# Patient Record
Sex: Male | Born: 1950 | Hispanic: No | Marital: Single | Smoking: Never smoker
Health system: Southern US, Community
[De-identification: ages and names within clinical notes are randomized; demographics above are authoritative.]

## PROBLEM LIST (undated history)

## (undated) DIAGNOSIS — E7800 Pure hypercholesterolemia, unspecified: Secondary | ICD-10-CM

## (undated) DIAGNOSIS — I10 Essential (primary) hypertension: Secondary | ICD-10-CM

## (undated) DIAGNOSIS — D696 Thrombocytopenia, unspecified: Secondary | ICD-10-CM

## (undated) DIAGNOSIS — N400 Enlarged prostate without lower urinary tract symptoms: Secondary | ICD-10-CM

## (undated) HISTORY — DX: Essential (primary) hypertension: I10

## (undated) HISTORY — PX: CATARACT EXTRACTION, BILATERAL: SHX1313

## (undated) HISTORY — PX: TONSILECTOMY/ADENOIDECTOMY WITH MYRINGOTOMY: SHX6125

## (undated) HISTORY — DX: Pure hypercholesterolemia, unspecified: E78.00

## (undated) HISTORY — DX: Benign prostatic hyperplasia without lower urinary tract symptoms: N40.0

## (undated) HISTORY — DX: Thrombocytopenia, unspecified: D69.6

---

## 2015-07-27 ENCOUNTER — Other Ambulatory Visit: Payer: Self-pay | Admitting: Family Medicine

## 2015-07-27 DIAGNOSIS — E010 Iodine-deficiency related diffuse (endemic) goiter: Secondary | ICD-10-CM

## 2015-08-01 ENCOUNTER — Ambulatory Visit
Admission: RE | Admit: 2015-08-01 | Discharge: 2015-08-01 | Disposition: A | Discharge status: Home / Self Care | Payer: 59 | Source: Ambulatory Visit | Attending: Family Medicine | Admitting: Family Medicine

## 2015-08-01 DIAGNOSIS — E010 Iodine-deficiency related diffuse (endemic) goiter: Secondary | ICD-10-CM

## 2016-08-11 ENCOUNTER — Other Ambulatory Visit: Payer: Self-pay | Admitting: Student

## 2016-08-11 DIAGNOSIS — S43431S Superior glenoid labrum lesion of right shoulder, sequela: Secondary | ICD-10-CM

## 2016-08-11 DIAGNOSIS — M7521 Bicipital tendinitis, right shoulder: Secondary | ICD-10-CM

## 2016-08-21 ENCOUNTER — Ambulatory Visit
Admission: RE | Admit: 2016-08-21 | Discharge: 2016-08-21 | Disposition: A | Discharge status: Home / Self Care | Payer: 59 | Source: Ambulatory Visit | Attending: Student | Admitting: Student

## 2016-08-21 DIAGNOSIS — S43431S Superior glenoid labrum lesion of right shoulder, sequela: Secondary | ICD-10-CM

## 2016-08-21 DIAGNOSIS — M7521 Bicipital tendinitis, right shoulder: Secondary | ICD-10-CM

## 2017-01-10 ENCOUNTER — Emergency Department
Admission: EM | Admit: 2017-01-10 | Discharge: 2017-01-10 | Disposition: A | Discharge status: Home / Self Care | Payer: Medicare Other | Attending: Emergency Medicine | Admitting: Emergency Medicine

## 2017-01-10 ENCOUNTER — Encounter: Payer: Self-pay | Admitting: Emergency Medicine

## 2017-01-10 DIAGNOSIS — W57XXXA Bitten or stung by nonvenomous insect and other nonvenomous arthropods, initial encounter: Secondary | ICD-10-CM | POA: Diagnosis not present

## 2017-01-10 DIAGNOSIS — Y999 Unspecified external cause status: Secondary | ICD-10-CM | POA: Insufficient documentation

## 2017-01-10 DIAGNOSIS — S60561A Insect bite (nonvenomous) of right hand, initial encounter: Secondary | ICD-10-CM | POA: Insufficient documentation

## 2017-01-10 DIAGNOSIS — R2231 Localized swelling, mass and lump, right upper limb: Secondary | ICD-10-CM | POA: Diagnosis present

## 2017-01-10 DIAGNOSIS — Y929 Unspecified place or not applicable: Secondary | ICD-10-CM | POA: Insufficient documentation

## 2017-01-10 DIAGNOSIS — Y939 Activity, unspecified: Secondary | ICD-10-CM | POA: Insufficient documentation

## 2017-01-10 MED ORDER — DOXYCYCLINE HYCLATE 100 MG PO CAPS: 100.0000 mg | ORAL_CAPSULE | Freq: Two times a day (BID) | ORAL | 0 refills | Status: DC

## 2017-01-10 MED ORDER — DOXYCYCLINE HYCLATE 100 MG PO TABS
100.0000 mg | ORAL_TABLET | Freq: Once | ORAL | Status: AC
Start: 2017-01-10 — End: 2017-01-10
  Administered 2017-01-10: 100 mg via ORAL
  Filled 2017-01-10: qty 1

## 2017-01-10 NOTE — ED Provider Notes (Signed)
[facility] Emergency Department Provider Note  ____________________________________________  Time seen: Approximately 10:11 PM  I have reviewed the triage vital signs and the nursing notes.   HISTORY  Chief Complaint Wound Infection   HPI Dustin Daniels is a 66 y.o. male who presents to the emergency department for evaluation and treatment of erythema and a blister that is between his fourth and fifth fingers that started after he pulled a tick off of his hand yesterday. He states that today the area of erythema has spread to the back of his hand and is now going around the finger into the palm of his hand. He reports having previous reactions to tick bites that are similar to this, but that the redness typically goes away by the next day. He states that he has had episodes where he has been stung and large blisters have risen. He feels that these blisters "trapped in the venom."  History reviewed. No pertinent past medical history.  There are no active problems to display for this patient.   History reviewed. No pertinent surgical history.  Prior to Admission medications   Medication Sig Start Date End Date Taking? Authorizing Provider  doxycycline (VIBRAMYCIN) 100 MG capsule Take 1 capsule (100 mg total) by mouth 2 (two) times daily. 01/10/17   Chinita Pester, FNP    Allergies Penicillins  History reviewed. No pertinent family history.  Social History Social History  Substance Use Topics  . Smoking status: Never Smoker  . Smokeless tobacco: Never Used  . Alcohol use No    Review of Systems  Constitutional: Negative for fever. Respiratory: Negative for cough or shortness of breath.  Musculoskeletal: Negative for myalgias Skin: Positive for tick bite and blister Neurological: Negative for numbness or paresthesias. ____________________________________________   PHYSICAL EXAM:  VITAL SIGNS: ED Triage Vitals [01/10/17 2005]  Enc Vitals  Group     BP (!) 150/90     Pulse Rate 80     Resp 18     Temp 98.6 F (37 C)     Temp Source Oral     SpO2 97 %     Weight 165 lb (74.8 kg)     Height 5\' 10"  (1.778 m)     Head Circumference      Peak Flow      Pain Score 0     Pain Loc      Pain Edu?      Excl. in GC?      Constitutional: Well appearing. Eyes: Conjunctivae are clear without discharge or drainage. Nose: No rhinorrhea noted. Mouth/Throat: Airway is patent.  Neck: No stridor. Unrestricted range of motion observed.  Cardiovascular: Capillary refill is <3 seconds.  Respiratory: Respirations are even and unlabored.. Musculoskeletal: Unrestricted range of motion observed. Neurologic: Awake, alert, and oriented x 4.  Skin:  Webspace of the right hand between the fourth and fifth fingers demonstrates 2 small vesicles with a surrounding area of erythema that extends towards the dorsal aspect of the hand as well as towards the palmar surface. There is no apparent lymphangitis.  ____________________________________________   LABS (all labs ordered are listed, but only abnormal results are displayed)  Labs Reviewed - No data to display ____________________________________________  EKG  Not indicated ____________________________________________  RADIOLOGY  Not indicated ____________________________________________   PROCEDURES  Procedure(s) performed: None ____________________________________________   INITIAL IMPRESSION / ASSESSMENT AND PLAN / ED COURSE  Dustin Daniels is a 66 y.o. male who presents to the emergency department for  evaluation and treatment of vesicles and an erythematous area on the right hand after removing a tick yesterday. He reports having similar reaction to insect bites, but states that the redness typically dissipates over 24 hours and this time it has only gotten larger. He was given a prescription for doxycycline and advised to follow up with his primary care provider for  symptoms that are not improving over the next 2-3 days. He was advised to return return to the ER for symptoms that change or worsen if unable to schedule an appointment.   Pertinent labs & imaging results that were available during my care of the patient were reviewed by me and considered in my medical decision making (see chart for details). ____________________________________________   FINAL CLINICAL IMPRESSION(S) / ED DIAGNOSES  Final diagnoses:  Tick bite, initial encounter    Discharge Medication List as of 01/10/2017  9:56 PM    START taking these medications   Details  doxycycline (VIBRAMYCIN) 100 MG capsule Take 1 capsule (100 mg total) by mouth 2 (two) times daily., Starting Sat 01/10/2017, Print        If controlled substance prescribed during this visit, 12 month history viewed on the [facility] prior to issuing an initial prescription for Schedule II or III opiod.   Note:  This document was prepared using Dragon voice recognition software and may include unintentional dictation errors.    Chinita Pester, FNP 01/10/17 2220    Jeanmarie Plant, MD 01/11/17 1102

## 2017-01-10 NOTE — ED Triage Notes (Signed)
Pt states he removed a tick from between his right fourth and fifth finger yesterday. Pt now has redness and small abscess noted to area. Pt denies finger.

## 2017-01-10 NOTE — Discharge Instructions (Signed)
Please take the antibiotic as prescribed until finished. If the area does not improve over the next 2-3 days, please follow up with her primary care provider. If you experience fevers or other symptoms of concern and you are unable to schedule an appointment, return to the emergency department.

## 2017-01-10 NOTE — ED Notes (Signed)

## 2017-01-10 NOTE — ED Notes (Signed)
Pt stating that he "pulled a tick" from in between his 4th and 5th fingers of his right hand. Pt stating that he is concerned for lyme's disease. Pt has some swelling to his 5th knuckle area. Pt denying any fevers.

## 2018-11-29 ENCOUNTER — Other Ambulatory Visit: Payer: Self-pay | Admitting: Sports Medicine

## 2018-11-29 DIAGNOSIS — M19011 Primary osteoarthritis, right shoulder: Secondary | ICD-10-CM

## 2018-11-29 DIAGNOSIS — G8929 Other chronic pain: Secondary | ICD-10-CM

## 2018-11-29 DIAGNOSIS — M7541 Impingement syndrome of right shoulder: Secondary | ICD-10-CM

## 2018-11-29 DIAGNOSIS — M778 Other enthesopathies, not elsewhere classified: Secondary | ICD-10-CM

## 2018-12-11 ENCOUNTER — Other Ambulatory Visit: Payer: Self-pay

## 2018-12-11 ENCOUNTER — Ambulatory Visit
Admission: RE | Admit: 2018-12-11 | Discharge: 2018-12-11 | Disposition: A | Discharge status: Home / Self Care | Payer: Medicare Other | Source: Ambulatory Visit | Attending: Sports Medicine | Admitting: Sports Medicine

## 2018-12-11 DIAGNOSIS — M778 Other enthesopathies, not elsewhere classified: Secondary | ICD-10-CM

## 2018-12-11 DIAGNOSIS — M25512 Pain in left shoulder: Secondary | ICD-10-CM

## 2018-12-11 DIAGNOSIS — G8929 Other chronic pain: Secondary | ICD-10-CM

## 2018-12-11 DIAGNOSIS — M7541 Impingement syndrome of right shoulder: Secondary | ICD-10-CM

## 2018-12-11 DIAGNOSIS — M19011 Primary osteoarthritis, right shoulder: Secondary | ICD-10-CM

## 2018-12-29 ENCOUNTER — Ambulatory Visit: Payer: Medicare Other

## 2019-01-27 ENCOUNTER — Other Ambulatory Visit: Payer: Self-pay

## 2019-01-27 ENCOUNTER — Ambulatory Visit
Admission: RE | Admit: 2019-01-27 | Discharge: 2019-01-27 | Disposition: A | Discharge status: Home / Self Care | Payer: Medicare Other | Source: Ambulatory Visit | Attending: Sports Medicine | Admitting: Sports Medicine

## 2019-01-27 DIAGNOSIS — M7581 Other shoulder lesions, right shoulder: Secondary | ICD-10-CM | POA: Diagnosis present

## 2019-01-27 DIAGNOSIS — M25512 Pain in left shoulder: Secondary | ICD-10-CM | POA: Insufficient documentation

## 2019-01-27 DIAGNOSIS — M19011 Primary osteoarthritis, right shoulder: Secondary | ICD-10-CM | POA: Diagnosis present

## 2019-01-27 DIAGNOSIS — G8929 Other chronic pain: Secondary | ICD-10-CM | POA: Diagnosis present

## 2019-01-27 DIAGNOSIS — M7541 Impingement syndrome of right shoulder: Secondary | ICD-10-CM | POA: Insufficient documentation

## 2020-03-29 ENCOUNTER — Inpatient Hospital Stay: Payer: Medicare Other

## 2020-03-29 ENCOUNTER — Encounter (INDEPENDENT_AMBULATORY_CARE_PROVIDER_SITE_OTHER): Payer: Self-pay

## 2020-03-29 ENCOUNTER — Inpatient Hospital Stay: Payer: Medicare Other | Attending: Oncology | Admitting: Oncology

## 2020-03-29 ENCOUNTER — Encounter: Payer: Self-pay | Admitting: Oncology

## 2020-03-29 VITALS — BP 146/103 | HR 62 | Temp 97.5°F | Resp 16 | Ht 70.0 in | Wt 151.0 lb

## 2020-03-29 DIAGNOSIS — D6959 Other secondary thrombocytopenia: Secondary | ICD-10-CM | POA: Diagnosis present

## 2020-03-29 DIAGNOSIS — D696 Thrombocytopenia, unspecified: Secondary | ICD-10-CM | POA: Diagnosis not present

## 2020-03-29 NOTE — Progress Notes (Signed)
Pt was concerned about platelet coung they have been in low normals for years but this time it was below the low normal range

## 2020-04-01 NOTE — Progress Notes (Signed)
Hematology/Oncology Consult note [facility] [phone] [phone]  Patient Care Team: Marisue Ivan, MD as PCP - General (Family Medicine)   Name of the patient: Dustin Daniels  [id number]  11/23/1950    Reason for referral- thrombocytopenia   Referring physician- DR. Linthavong   Date of visit: 04/01/20   History of presenting illness- patient is a 69 year old male with a past medical history significant for hypertension hypercholesterolemia and BPH among other medical problems.  He has been referred to [place] for thrombocytopenia.  Most recent CBC from 03/22/2020 showed white count of 4.9, H&H of 15.3/45.3 and platelet count of 113.  Looking back at his prior CBCs patient has had mild chronic thrombocytopenia with a platelet count fluctuates between 120s to 140s.  He has always had isolated thrombocytopenia in the absence of other cytopenias.  He was also tested positive for Covid in August 2021.  Denies any recent changes in his medications today.  Denies any over-the-counter herbal supplements.  Denies any bleeding or bruising.  ECOG PS- 1  Pain scale- 0   Review of systems- Review of Systems  Constitutional: Negative for chills, fever, malaise/fatigue and weight loss.  HENT: Negative for congestion, ear discharge and nosebleeds.   Eyes: Negative for blurred vision.  Respiratory: Negative for cough, hemoptysis, sputum production, shortness of breath and wheezing.   Cardiovascular: Negative for chest pain, palpitations, orthopnea and claudication.  Gastrointestinal: Negative for abdominal pain, blood in stool, constipation, diarrhea, heartburn, melena, nausea and vomiting.  Genitourinary: Negative for dysuria, flank pain, frequency, hematuria and urgency.  Musculoskeletal: Negative for back pain, joint pain and myalgias.  Skin: Negative for rash.  Neurological: Negative for dizziness, tingling, focal weakness, seizures, weakness and  headaches.  Endo/Heme/Allergies: Does not bruise/bleed easily.  Psychiatric/Behavioral: Negative for depression and suicidal ideas. The patient does not have insomnia.     Allergies  Allergen Reactions  . Penicillins     There are no problems to display for this patient.    Past Medical History:  Diagnosis Date  . BPH (benign prostatic hyperplasia)   . High cholesterol   . Hypertension   . Thrombocytopenia (HCC)      Past Surgical History:  Procedure Laterality Date  . CATARACT EXTRACTION, BILATERAL     pt said it was 15 years ago and does not remember how he got it  . TONSILECTOMY/ADENOIDECTOMY WITH MYRINGOTOMY      Social History   Socioeconomic History  . Marital status: Single    Spouse name: Not on file  . Number of children: Not on file  . Years of education: Not on file  . Highest education level: Not on file  Occupational History  . Not on file  Tobacco Use  . Smoking status: Never Smoker  . Smokeless tobacco: Never Used  Vaping Use  . Vaping Use: Never used  Substance and Sexual Activity  . Alcohol use: Yes    Comment: litlle amount .6 oz. 2-3 times a year  . Drug use: No  . Sexual activity: Yes  Other Topics Concern  . Not on file  Social History Narrative  . Not on file   Social Determinants of Health   Financial Resource Strain:   . Difficulty of Paying Living Expenses: Not on file  Food Insecurity:   . Worried About Programme researcher, broadcasting/film/video in the Last Year: Not on file  . Ran Out of Food in the Last Year: Not on file  Transportation Needs:   .  Lack of Transportation (Medical): Not on file  . Lack of Transportation (Non-Medical): Not on file  Physical Activity:   . Days of Exercise per Week: Not on file  . Minutes of Exercise per Session: Not on file  Stress:   . Feeling of Stress : Not on file  Social Connections:   . Frequency of Communication with Friends and Family: Not on file  . Frequency of Social Gatherings with Friends and  Family: Not on file  . Attends Religious Services: Not on file  . Active Member of Clubs or Organizations: Not on file  . Attends Banker Meetings: Not on file  . Marital Status: Not on file  Intimate Partner Violence:   . Fear of Current or Ex-Partner: Not on file  . Emotionally Abused: Not on file  . Physically Abused: Not on file  . Sexually Abused: Not on file     No family history on file.  No current outpatient medications on file.   Physical exam:  Vitals:   03/29/20 1019  BP: (!) 146/103  Pulse: 62  Resp: 16  Temp: (!) 97.5 F (36.4 C)  TempSrc: Tympanic  Weight: 151 lb (68.5 kg)  Height: 5\' 10"  (1.778 m)   Physical Exam HENT:     Head: Normocephalic and atraumatic.  Eyes:     Pupils: Pupils are equal, round, and reactive to light.  Cardiovascular:     Rate and Rhythm: Normal rate and regular rhythm.     Heart sounds: Normal heart sounds.  Pulmonary:     Effort: Pulmonary effort is normal.     Breath sounds: Normal breath sounds.  Abdominal:     General: Bowel sounds are normal.     Palpations: Abdomen is soft.     Comments: No palpable hepatosplenomegaly  Musculoskeletal:     Cervical back: Normal range of motion.  Lymphadenopathy:     Comments: No palpable cervical, supraclavicular, axillary or inguinal adenopathy   Skin:    General: Skin is warm and dry.  Neurological:     Mental Status: He is alert and oriented to person, place, and time.         Assessment and plan- Patient is a 69 y.o. male referred for mild isolated thrombocytopenia  Patient has chronic thrombocytopenia in the absence of other cytopenias.  CMP is normal not suggestive of any chronic liver disease.  Hepatitis C testing was negative.  Since he recently had labs I am not getting any repeat labs at this time.  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  Does not require any treatment unless platelet counts are less than 30.  Thank you for this kind referral and the opportunity to participate in the care of this  Patient   Visit Diagnosis 1. Thrombocytopenia (HCC)     Dr. [id number], MD, MPH [facility] at [facility] 04/01/2020

## 2020-04-04 ENCOUNTER — Ambulatory Visit: Payer: Medicare Other | Admitting: Urology

## 2020-05-04 ENCOUNTER — Inpatient Hospital Stay: Payer: Medicare Other | Admitting: Oncology

## 2020-06-30 IMAGING — MR MR SHOULDER*R* W/O CM
5 of 9 series · 18 of 40 positions shown · non-contrast
Comparison: None.

CLINICAL DATA: Painful range of motion.

EXAM:
MRI OF THE RIGHT SHOULDER WITHOUT CONTRAST
TECHNIQUE: Multiplanar, multisequence MR imaging of the shoulder was performed.
No intravenous contrast was administered.

[Series 5: PD fat-sat · axial · right · 4.0mm · 0.59mm/px · z∈[-89,+41]mm · 4 of 28 slices shown (1 of 2)]
[im 1/28]
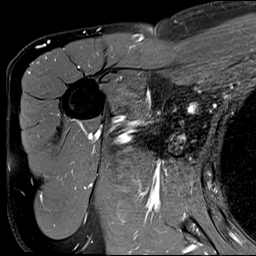
[im 10/28]
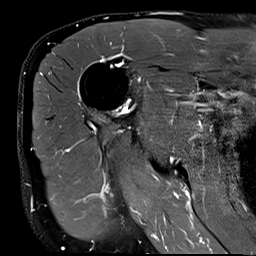
[im 19/28]
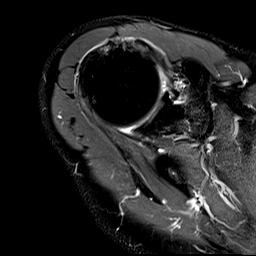
[im 28/28]
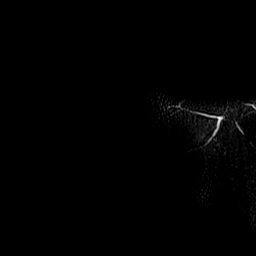

[Series 6: PD fat-sat · sagittal · right · 4.0mm · 0.44mm/px · 4 of 26 slices shown (2 of 2)]
[im 1/26]
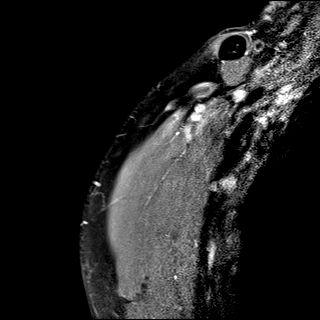
[im 9/26]
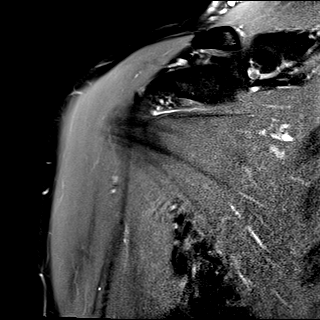
[im 17/26]
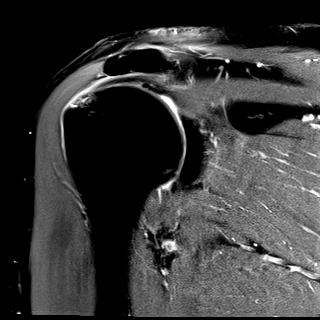
[im 26/26]
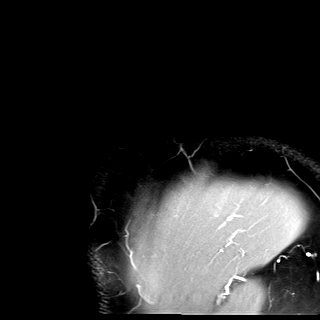

[Series 7: T2 fat-sat · sagittal · right · 4.0mm · 0.44mm/px · 4 of 26 slices shown (1 of 2)]
[im 1/26]
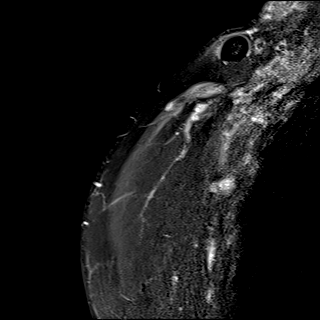
[im 9/26]
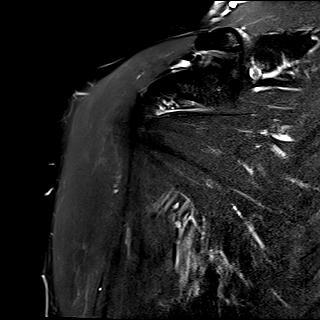
[im 17/26]
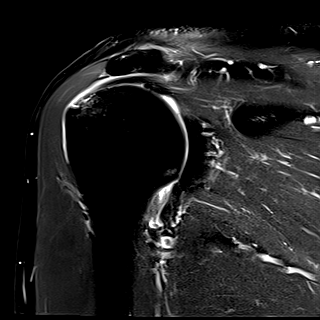
[im 26/26]
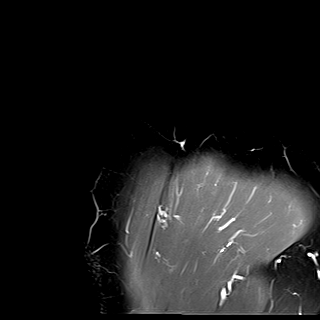

[Series 8: T2 fat-sat · oblique · right · 4.0mm · 0.23mm/px · 3 of 24 slices shown (2 of 2)]
[im 1/24]
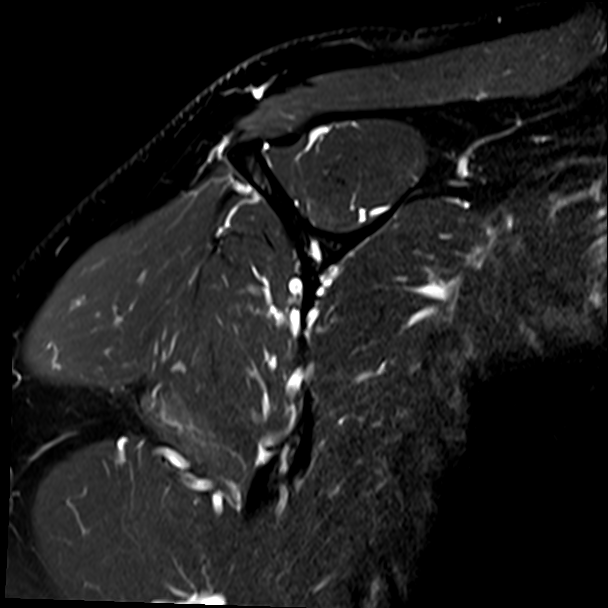
[im 12/24]
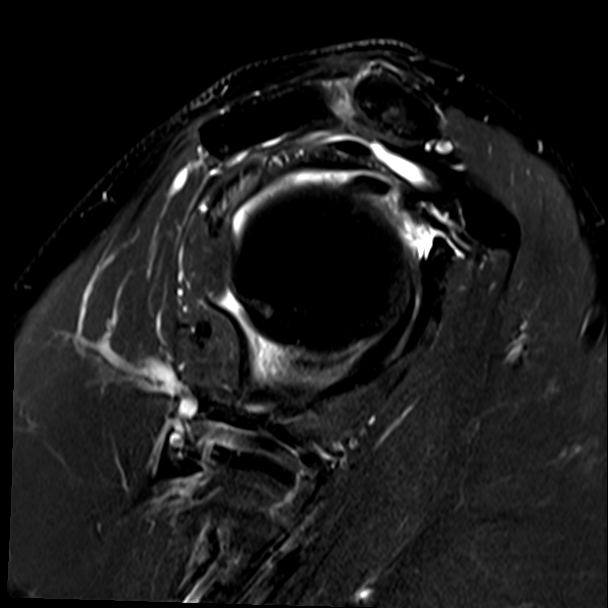
[im 24/24]
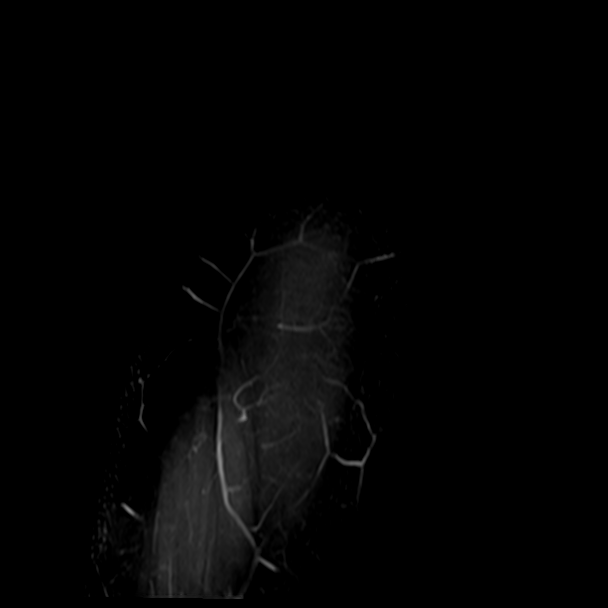

[Series 9: T1 · oblique · right · 4.0mm · 0.36mm/px · 3 of 24 slices shown]
[im 1/24]
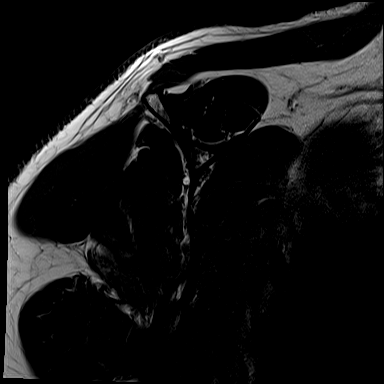
[im 12/24]
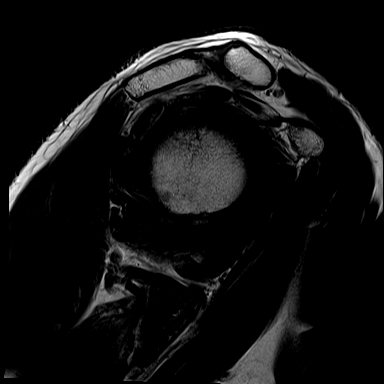
[im 24/24]
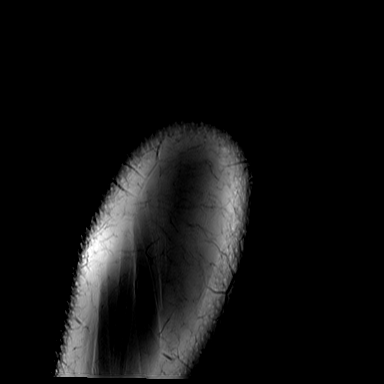

[18 of 40 positions shown; findings below may reference images not displayed]

FINDINGS: Rotator cuff: Large high-grade partial-thickness articular surface
tear of the supraspinatus tendon and mild subcortical reactive
marrow edema. Moderate tendinosis of the infraspinatus tendon with a
small partial-thickness bursal surface tear. Teres minor tendon is
intact. Subscapularis tendon is intact.

Muscles: No atrophy or fatty replacement of nor abnormal signal
within, the muscles of the rotator cuff.

Biceps long head: Moderate tendinosis of the intra-articular portion
of the long head of the biceps tendon.

Acromioclavicular Joint: Mild arthropathy of the acromioclavicular
joint. Type II acromion. Trace subacromial/subdeltoid bursal fluid.

Glenohumeral Joint: Small joint effusion. Mild chondral thinning of
the inferior humeral head.

Labrum: Grossly intact, but evaluation is limited by lack of
intraarticular fluid.

Bones:  No acute osseous abnormality.  No aggressive osseous lesion.

Other: No fluid collection or hematoma.
IMPRESSION: 1. Large high-grade partial-thickness articular surface tear of the
supraspinatus tendon and mild subcortical reactive marrow edema.
2. Moderate tendinosis of the infraspinatus tendon with a small
partial-thickness bursal surface tear.
3. Moderate tendinosis of the intra-articular portion of the long
head of the biceps tendon.
